# Patient Record
Sex: Male | Born: 1960 | Hispanic: No | Smoking: Current some day smoker
Health system: Southern US, Community
[De-identification: ages and names within clinical notes are randomized; demographics above are authoritative.]

## PROBLEM LIST (undated history)

## (undated) DIAGNOSIS — S42309A Unspecified fracture of shaft of humerus, unspecified arm, initial encounter for closed fracture: Secondary | ICD-10-CM

## (undated) HISTORY — DX: Unspecified fracture of shaft of humerus, unspecified arm, initial encounter for closed fracture: S42.309A

---

## 2001-03-03 HISTORY — PX: APPENDECTOMY: SHX54

## 2004-01-18 ENCOUNTER — Inpatient Hospital Stay (HOSPITAL_COMMUNITY): Admission: EM | Admit: 2004-01-18 | Discharge: 2004-01-19 | Payer: Self-pay | Admitting: Emergency Medicine

## 2005-07-08 IMAGING — CT CT PELVIS W/ CM
1 of 4 series · 14 of 32 positions shown, 19 images · IV contrast (omnipaque)
Comparison: none

CLINICAL DATA: Abdominal pain and nausea.
TECHNIQUE: Helical CT examination of the abdomen and pelvis was performed after the bolus infusion of a total of 150 cc of Omnipaque 300 and the use of dilute oral contrast. 
 The lung bases are clear. 
 CT OF THE ABDOMEN WITH CONTRAST:
 The liver, spleen, pancreas, adrenal glands and kidneys are unremarkable. There is a simple-appearing cyst associated with the right kidney.  The aorta is normal in caliber and the major branch vessels are normal.  The stomach is distended with contrast and demonstrates no gross abnormalities. The small bowel is slightly dilated and the colon appears unremarkable.  No abdominal masses or adenopathy. There are mild atherosclerotic changes involving the distal aorta. 
 The appendix is markedly inflamed and there is periappendiceal phlegmon and a small amount of fluid which is tracking down into the pelvis.  The terminal ileum appears normal. There is mild pericecal inflammatory change also.

[Series 2: abd/pelvis 5.0 b30f · axial · 0.68mm/px · z∈[+1220,+1664]mm · 14 of 101 slices shown, 19 images]
[im 6/101  soft-tissue]
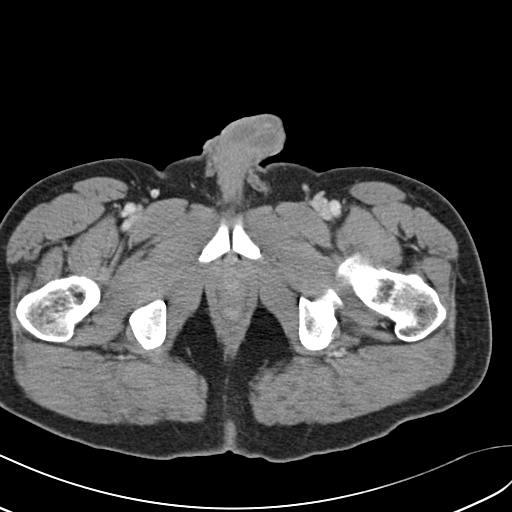
[im 6/101  bone]
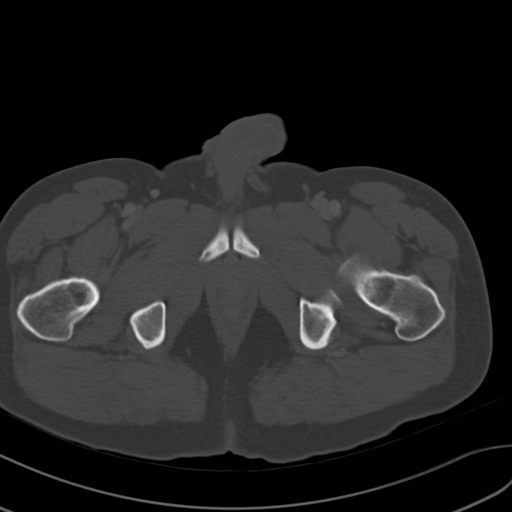
[im 12/101  soft-tissue]
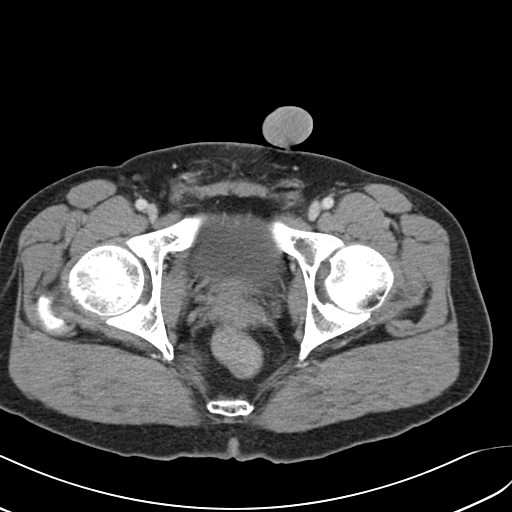
[im 24/101  soft-tissue]
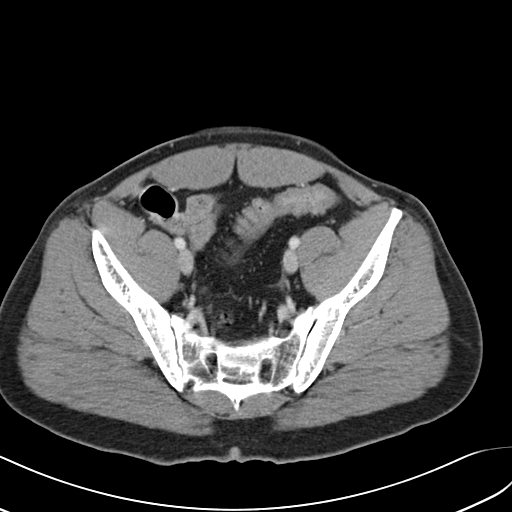
[im 30/101  soft-tissue]
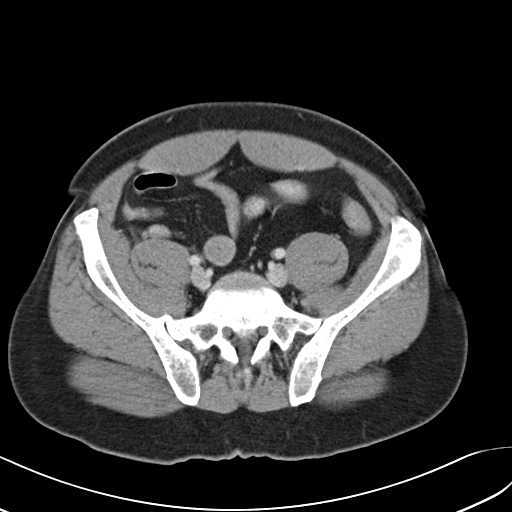
[im 36/101  soft-tissue]
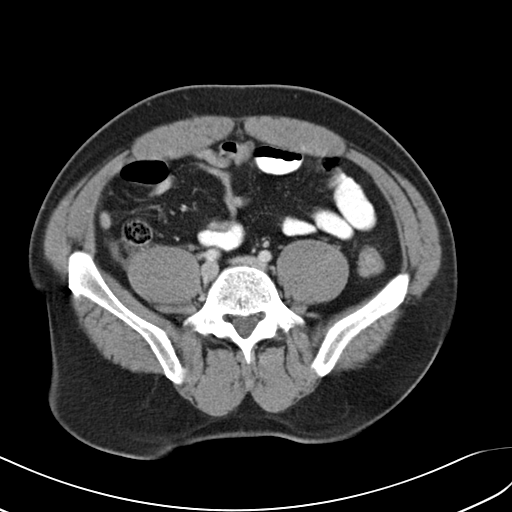
[im 42/101  soft-tissue]
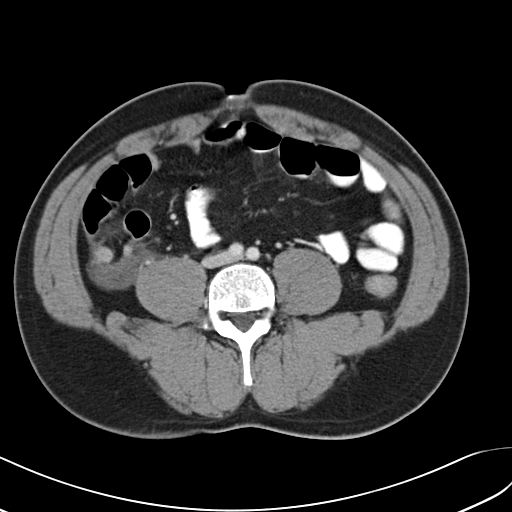
[im 53/101  soft-tissue]
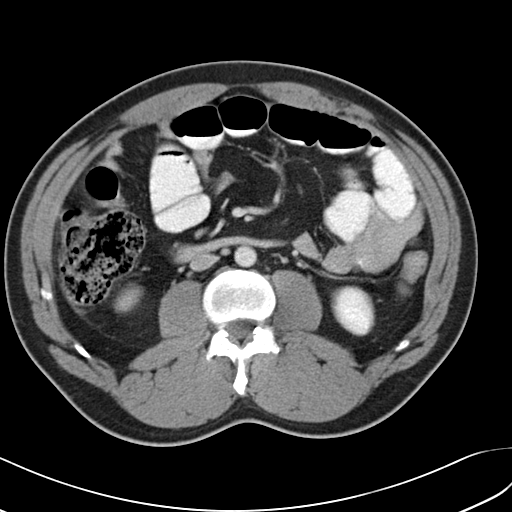
[im 59/101  soft-tissue]
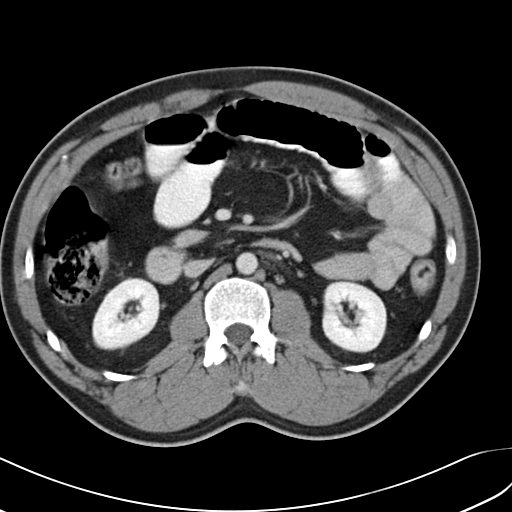
[im 65/101  soft-tissue]
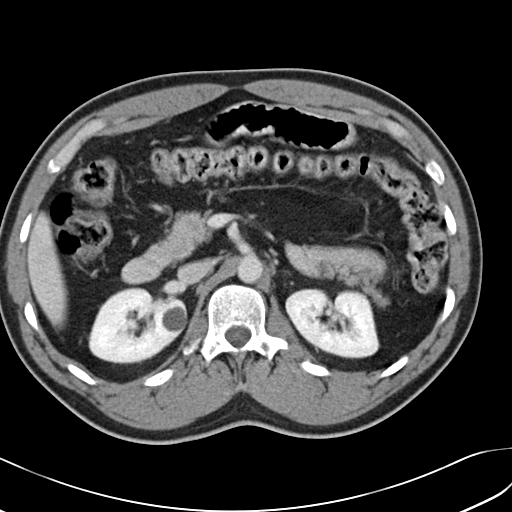
[im 65/101  bone]
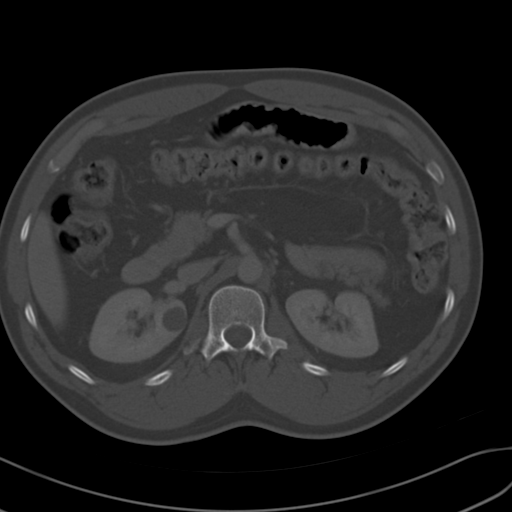
[im 71/101  soft-tissue]
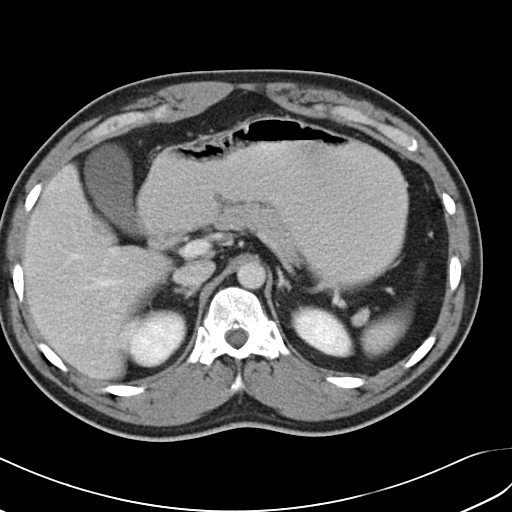
[im 77/101  soft-tissue]
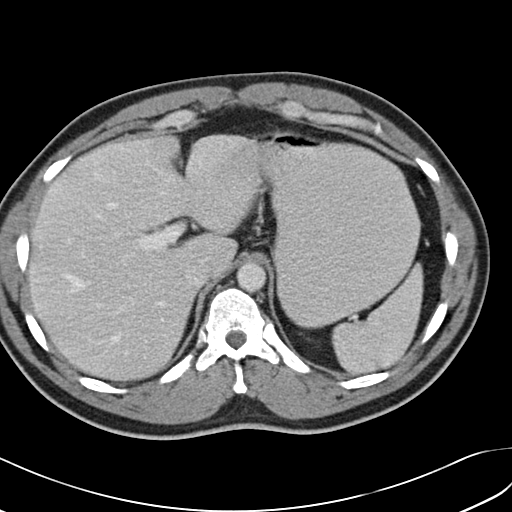
[im 77/101  lung]
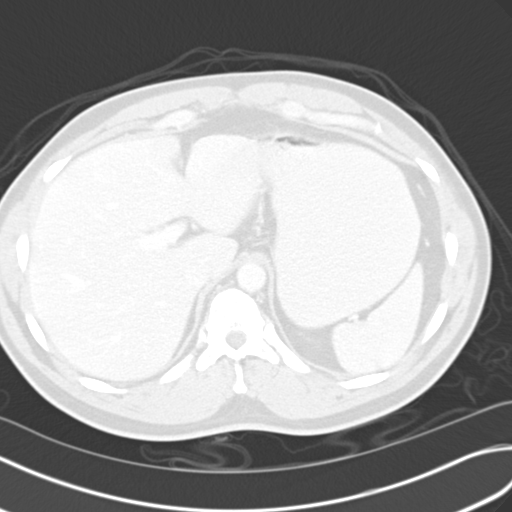
[im 83/101  lung]
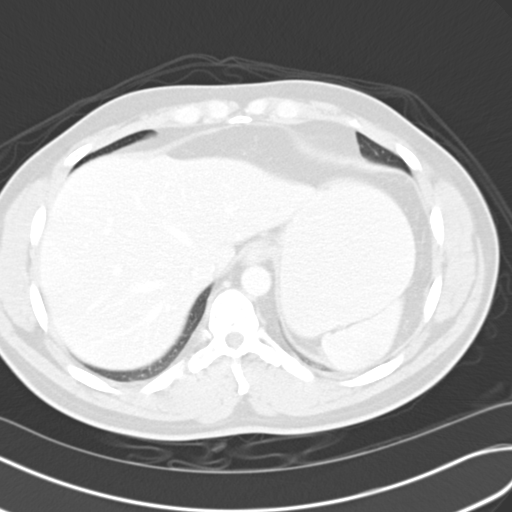
[im 89/101  soft-tissue]
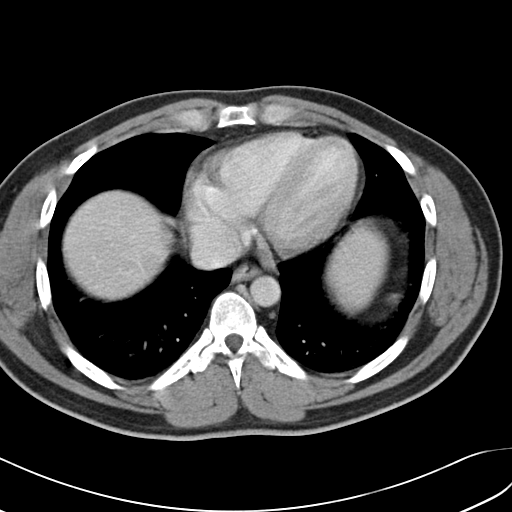
[im 89/101  lung]
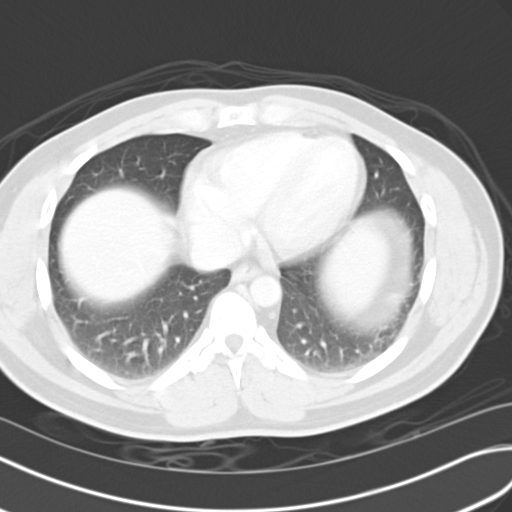
[im 95/101  soft-tissue]
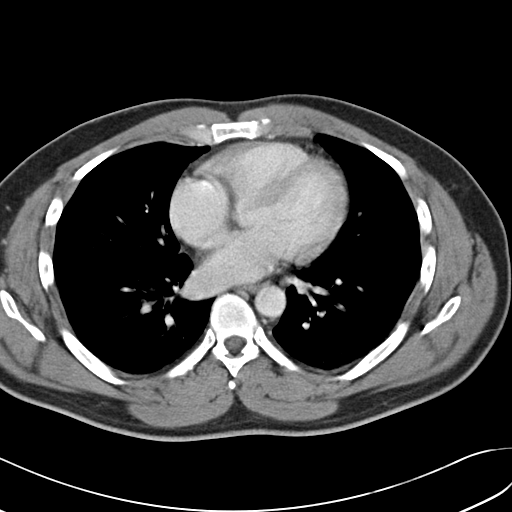
[im 95/101  lung]
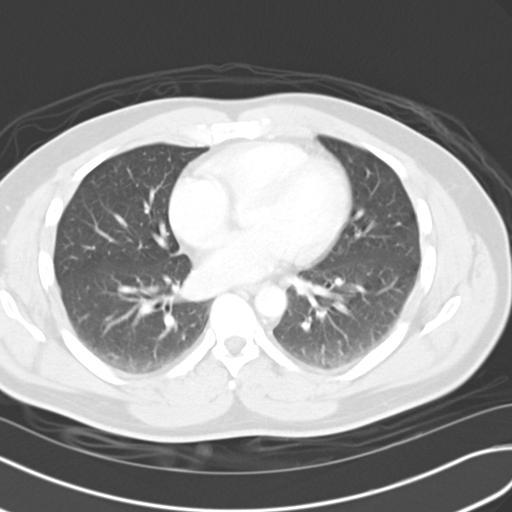

[14 of 32 positions shown; findings below may reference images not displayed]

IMPRESSION: 1. Findings consistent with appendicitis with periappendiceal phlegmon and fluid tracking down into the pelvic.  I couldn?t totally exclude perforation.  
 CT PELVIS WITH CONTRAST:
 Small amount of free pelvic fluid, likely from the patient?s appendicitis.  Small amount in the cul-de-sac and in the left paracolic gutter area.  The rectum, sigmoid colon, and visualized small bowel are grossly normal.  The bladder is normal.
IMPRESSION: 1.  Free fluid in the pelvis, likely from the patient?s appendicitis.

## 2015-04-16 ENCOUNTER — Ambulatory Visit: Payer: Medicaid Other | Attending: Family Medicine | Admitting: Family Medicine

## 2015-04-16 ENCOUNTER — Encounter: Payer: Self-pay | Admitting: Family Medicine

## 2015-04-16 ENCOUNTER — Encounter: Payer: Self-pay | Admitting: Gastroenterology

## 2015-04-16 VITALS — BP 159/81 | HR 88 | Temp 98.3°F | Resp 16 | Ht 69.5 in | Wt 181.0 lb

## 2015-04-16 DIAGNOSIS — Z87891 Personal history of nicotine dependence: Secondary | ICD-10-CM | POA: Diagnosis not present

## 2015-04-16 DIAGNOSIS — N400 Enlarged prostate without lower urinary tract symptoms: Secondary | ICD-10-CM | POA: Insufficient documentation

## 2015-04-16 DIAGNOSIS — R030 Elevated blood-pressure reading, without diagnosis of hypertension: Secondary | ICD-10-CM | POA: Insufficient documentation

## 2015-04-16 DIAGNOSIS — Z114 Encounter for screening for human immunodeficiency virus [HIV]: Secondary | ICD-10-CM

## 2015-04-16 DIAGNOSIS — Z9889 Other specified postprocedural states: Secondary | ICD-10-CM | POA: Diagnosis not present

## 2015-04-16 DIAGNOSIS — R109 Unspecified abdominal pain: Secondary | ICD-10-CM | POA: Insufficient documentation

## 2015-04-16 DIAGNOSIS — Z0000 Encounter for general adult medical examination without abnormal findings: Secondary | ICD-10-CM | POA: Diagnosis not present

## 2015-04-16 DIAGNOSIS — IMO0001 Reserved for inherently not codable concepts without codable children: Secondary | ICD-10-CM | POA: Insufficient documentation

## 2015-04-16 DIAGNOSIS — R1030 Lower abdominal pain, unspecified: Secondary | ICD-10-CM | POA: Insufficient documentation

## 2015-04-16 DIAGNOSIS — Z1159 Encounter for screening for other viral diseases: Secondary | ICD-10-CM

## 2015-04-16 LAB — CBC
HCT: 41.7 % (ref 39.0–52.0)
Hemoglobin: 14.6 g/dL (ref 13.0–17.0)
MCH: 31.7 pg (ref 26.0–34.0)
MCHC: 35 g/dL (ref 30.0–36.0)
MCV: 90.7 fL (ref 78.0–100.0)
MPV: 9 fL (ref 8.6–12.4)
Platelets: 201 10*3/uL (ref 150–400)
RBC: 4.6 MIL/uL (ref 4.22–5.81)
RDW: 13.3 % (ref 11.5–15.5)
WBC: 4.6 10*3/uL (ref 4.0–10.5)

## 2015-04-16 LAB — LIPID PANEL
Cholesterol: 167 mg/dL (ref 125–200)
HDL: 32 mg/dL — ABNORMAL LOW (ref >=40)
LDL Cholesterol: 122 mg/dL (ref <130)
Total CHOL/HDL Ratio: 5.2 Ratio — ABNORMAL HIGH (ref <=5.0)
Triglycerides: 64 mg/dL (ref <150)
VLDL: 13 mg/dL (ref <30)

## 2015-04-16 LAB — COMPLETE METABOLIC PANEL WITH GFR
ALT: 28 U/L (ref 9–46)
AST: 23 U/L (ref 10–35)
Albumin: 3.8 g/dL (ref 3.6–5.1)
Alkaline Phosphatase: 82 U/L (ref 40–115)
BUN: 12 mg/dL (ref 7–25)
CO2: 27 mmol/L (ref 20–31)
Calcium: 9.4 mg/dL (ref 8.6–10.3)
Chloride: 108 mmol/L (ref 98–110)
Creat: 1.13 mg/dL (ref 0.70–1.33)
GFR, Est African American: 86 mL/min (ref >=60)
GFR, Est Non African American: 74 mL/min (ref >=60)
Glucose, Bld: 103 mg/dL — ABNORMAL HIGH (ref 65–99)
Potassium: 4.7 mmol/L (ref 3.5–5.3)
Sodium: 141 mmol/L (ref 135–146)
Total Bilirubin: 0.8 mg/dL (ref 0.2–1.2)
Total Protein: 6.7 g/dL (ref 6.1–8.1)

## 2015-04-16 LAB — HEMOCCULT GUIAC POC 1CARD (OFFICE): Fecal Occult Blood, POC: NEGATIVE

## 2015-04-16 LAB — POCT URINALYSIS DIPSTICK
Bilirubin, UA: NEGATIVE
Glucose, UA: NEGATIVE
Ketones, UA: NEGATIVE
Leukocytes, UA: NEGATIVE
Nitrite, UA: NEGATIVE
Spec Grav, UA: >=1.03
Urobilinogen, UA: 0.2
pH, UA: 5.5

## 2015-04-16 LAB — POCT GLYCOSYLATED HEMOGLOBIN (HGB A1C): Hemoglobin A1C: 6

## 2015-04-16 NOTE — Progress Notes (Signed)
Complaining of low abdominal pain and pain on button No pain today. No tobacco user  No suicidal thought in the past two weeks

## 2015-04-16 NOTE — Progress Notes (Signed)
LOGO@  Subjective:  Patient ID: Devon Hill, male    DOB: 06/03/1960  Age: 56 y.o. MRN: [id number]  CC: Abdominal Pain   HPI Devon Hill presents for    1. Lower abdominal pain: x 1-2 months. At the onset the pain was sudden. He was sitting on the toilet, grabbed his testicles and vomited. No recurrent vomiting. He has not had a colonoscopy.  Also gets pain in his in L bottom. That comes and goes. No blood in stool. No fever or chills.  No pain currently.  He has a dull pain a few days ago. He has noticed weight loss but unsure of the exact amount.   History reviewed. No pertinent past medical history.  Past Surgical History  Procedure Laterality Date  . Appendectomy  2003    Family History  Problem Relation Age of Onset  . Diabetes Mother     Social History  Substance Use Topics  . Smoking status: Former Research scientist (life sciences)  . Smokeless tobacco: Not on file  . Alcohol Use: Not on file     Comment: [place]    ROS Review of Systems  Constitutional: Negative for fever, chills, fatigue and unexpected weight change.  Eyes: Negative for visual disturbance.  Respiratory: Negative for cough and shortness of breath.   Cardiovascular: Negative for chest pain, palpitations and leg swelling.  Gastrointestinal: Positive for abdominal pain. Negative for nausea, vomiting, diarrhea, constipation, blood in stool, abdominal distention, anal bleeding and rectal pain.  Endocrine: Negative for polydipsia, polyphagia and polyuria.  Genitourinary: Positive for frequency. Negative for dysuria, urgency, hematuria, flank pain, decreased urine volume, discharge, penile swelling, scrotal swelling, enuresis, difficulty urinating, genital sores, penile pain and testicular pain.  Musculoskeletal: Negative for myalgias, back pain, arthralgias, gait problem and neck pain.  Skin: Negative for rash.  Allergic/Immunologic: Negative for immunocompromised state.  Hematological: Negative for adenopathy. Does not  bruise/bleed easily.  Psychiatric/Behavioral: Negative for suicidal ideas, sleep disturbance and dysphoric mood. The patient is not nervous/anxious.     Objective:   Today's Vitals: BP 159/81 mmHg  Pulse 88  Temp(Src) 98.3 F (36.8 C) (Oral)  Resp 16  Ht 5' 9.5" (1.765 m)  Wt 181 lb (82.101 kg)  BMI 26.35 kg/m2  SpO2 98%  Physical Exam  Constitutional: He appears well-developed and well-nourished. No distress.  HENT:  Head: Normocephalic and atraumatic.  Neck: Normal range of motion. Neck supple.  Cardiovascular: Normal rate, regular rhythm, normal heart sounds and intact distal pulses.   Pulmonary/Chest: Effort normal and breath sounds normal.  Abdominal: There is no hepatosplenomegaly, splenomegaly or hepatomegaly. There is no tenderness. There is no rebound and no CVA tenderness. No hernia. Hernia confirmed negative in the ventral area, confirmed negative in the right inguinal area and confirmed negative in the left inguinal area.  Genitourinary: Rectum normal, testes normal and penis normal. Rectal exam shows no external hemorrhoid, no internal hemorrhoid, no fissure, no mass, no tenderness and anal tone normal. Guaiac negative stool. Prostate is enlarged. Prostate is not tender. Circumcised. No penile tenderness.  Musculoskeletal: He exhibits no edema.  Lymphadenopathy:       Right: No inguinal adenopathy present.       Left: No inguinal adenopathy present.  Neurological: He is alert.  Skin: Skin is warm and dry. No rash noted. No erythema.  Psychiatric: He has a normal mood and affect.    Lab Results  Component Value Date   HGBA1C 6.0 04/16/2015   UA: moderate blood UA  Assessment & Plan:   Problem List Items Addressed This Visit    Enlarged prostate   Relevant Orders   PSA   Elevated BP   Relevant Orders   Lipid Panel   Abdominal pain   Relevant Orders   COMPLETE METABOLIC PANEL WITH GFR   CBC   Hemoccult - 1 Card (office) (Completed)    Other Visit  Diagnoses    Healthcare maintenance    -  Primary    Relevant Orders    HgB A1c (Completed)    POCT urinalysis dipstick (Completed)    Ambulatory referral to Gastroenterology    Screening for HIV (human immunodeficiency virus)        Relevant Orders    HIV antibody (with reflex)    Need for hepatitis C screening test        Relevant Orders    Hepatitis C antibody, reflex       No outpatient encounter prescriptions on file as of 04/16/2015.   No facility-administered encounter medications on file as of 04/16/2015.    Follow-up: No Follow-up on file.    Boykin Nearing MD

## 2015-04-16 NOTE — Assessment & Plan Note (Signed)
A: elevated BP suspect HTN P: Repeat BP in two weeks If elevated, start norvasc 5 mg daily  CMP Lipids

## 2015-04-16 NOTE — Assessment & Plan Note (Signed)
Enlarged prostate on exam suspect BPH  PSA ordered

## 2015-04-16 NOTE — Patient Instructions (Addendum)
Devon Hill was seen today for abdominal pain.  Diagnoses and all orders for this visit:  Healthcare maintenance -     HgB A1c -     POCT urinalysis dipstick -     Ambulatory referral to Gastroenterology  Lower abdominal pain -     COMPLETE METABOLIC PANEL WITH GFR -     CBC -     Hemoccult - 1 Card (office)  Elevated BP -     Lipid Panel  Screening for HIV (human immunodeficiency virus) -     HIV antibody (with reflex)  Need for hepatitis C screening test -     Hepatitis C antibody, reflex  Enlarged prostate -     PSA    F/u in 2 weeks for BP check with clinical pharmacologist  F/u with me in 6 weeks for abdominal pain suspected stone   Dr. Adrian Blackwater  Hypertension Hypertension, commonly called high blood pressure, is when the force of blood pumping through your arteries is too strong. Your arteries are the blood vessels that carry blood from your heart throughout your body. A blood pressure reading consists of a higher number over a lower number, such as 110/72. The higher number (systolic) is the pressure inside your arteries when your heart pumps. The lower number (diastolic) is the pressure inside your arteries when your heart relaxes. Ideally you want your blood pressure below 120/80. Hypertension forces your heart to work harder to pump blood. Your arteries may become narrow or stiff. Having untreated or uncontrolled hypertension can cause heart attack, stroke, kidney disease, and other problems. RISK FACTORS Some risk factors for high blood pressure are controllable. Others are not.  Risk factors you cannot control include:   Race. You may be at higher risk if you are African American.  Age. Risk increases with age.  Gender. Men are at higher risk than women before age 48 years. After age 74, women are at higher risk than men. Risk factors you can control include:  Not getting enough exercise or physical activity.  Being overweight.  Getting too much fat, sugar,  calories, or salt in your diet.  Drinking too much alcohol. SIGNS AND SYMPTOMS Hypertension does not usually cause signs or symptoms. Extremely high blood pressure (hypertensive crisis) may cause headache, anxiety, shortness of breath, and nosebleed. DIAGNOSIS To check if you have hypertension, your health care provider will measure your blood pressure while you are seated, with your arm held at the level of your heart. It should be measured at least twice using the same arm. Certain conditions can cause a difference in blood pressure between your right and left arms. A blood pressure reading that is higher than normal on one occasion does not mean that you need treatment. If it is not clear whether you have high blood pressure, you may be asked to return on a different day to have your blood pressure checked again. Or, you may be asked to monitor your blood pressure at home for 1 or more weeks. TREATMENT Treating high blood pressure includes making lifestyle changes and possibly taking medicine. Living a healthy lifestyle can help lower high blood pressure. You may need to change some of your habits. Lifestyle changes may include:  Following the DASH diet. This diet is high in fruits, vegetables, and whole grains. It is low in salt, red meat, and added sugars.  Keep your sodium intake below 2,300 mg per day.  Getting at least 30-45 minutes of aerobic exercise at least 4  times per week.  Losing weight if necessary.  Not smoking.  Limiting alcoholic beverages.  Learning ways to reduce stress. Your health care provider may prescribe medicine if lifestyle changes are not enough to get your blood pressure under control, and if one of the following is true:  You are 35-44 years of age and your systolic blood pressure is above 140.  You are 110 years of age or older, and your systolic blood pressure is above 150.  Your diastolic blood pressure is above 90.  You have diabetes, and your  systolic blood pressure is over [id number] or your diastolic blood pressure is over 90.  You have kidney disease and your blood pressure is above 140/90.  You have heart disease and your blood pressure is above 140/90. Your personal target blood pressure may vary depending on your medical conditions, your age, and other factors. HOME CARE INSTRUCTIONS  Have your blood pressure rechecked as directed by your health care provider.   Take medicines only as directed by your health care provider. Follow the directions carefully. Blood pressure medicines must be taken as prescribed. The medicine does not work as well when you skip doses. Skipping doses also puts you at risk for problems.  Do not smoke.   Monitor your blood pressure at home as directed by your health care provider. SEEK MEDICAL CARE IF:   You think you are having a reaction to medicines taken.  You have recurrent headaches or feel dizzy.  You have swelling in your ankles.  You have trouble with your vision. SEEK IMMEDIATE MEDICAL CARE IF:  You develop a severe headache or confusion.  You have unusual weakness, numbness, or feel faint.  You have severe chest or abdominal pain.  You vomit repeatedly.  You have trouble breathing. MAKE SURE YOU:   Understand these instructions.  Will watch your condition.  Will get help right away if you are not doing well or get worse.   This information is not intended to replace advice given to you by your health care provider. Make sure you discuss any questions you have with your health care provider.   Document Released: 02/17/2005 Document Revised: 07/04/2014 Document Reviewed: 12/10/2012 Elsevier Interactive Patient Education 2016 [place].   Kidney Stones Kidney stones (urolithiasis) are solid masses that form inside your kidneys. The intense pain is caused by the stone moving through the kidney, ureter, bladder, and urethra (urinary tract). When the stone moves, the  ureter starts to spasm around the stone. The stone is usually passed in your pee (urine).  HOME CARE  Drink enough fluids to keep your pee clear or pale yellow. This helps to get the stone out.  Take a 24-hour pee (urine) sample as told by your doctor. You may need to take another sample every 6-12 months.  Strain all pee through the provided strainer. Do not pee without peeing through the strainer, not even once. If you pee the stone out, catch it in the strainer. The stone may be as small as a grain of salt. Take this to your doctor. This will help your doctor figure out what you can do to try to prevent more kidney stones.  Only take medicine as told by your doctor.  Make changes to your daily diet as told by your doctor. You may be told to:  Limit how much salt you eat.  Eat 5 or more servings of fruits and vegetables each day.  Limit how much meat, poultry, fish, and  eggs you eat.  Keep all follow-up visits as told by your doctor. This is important.  Get follow-up X-rays as told by your doctor. GET HELP IF: You have pain that gets worse even if you have been taking pain medicine. GET HELP RIGHT AWAY IF:   Your pain does not get better with medicine.  You have a fever or shaking chills.  Your pain increases and gets worse over 18 hours.  You have new belly (abdominal) pain.  You feel faint or pass out.  You are unable to pee.   This information is not intended to replace advice given to you by your health care provider. Make sure you discuss any questions you have with your health care provider.   Document Released: 08/06/2007 Document Revised: 11/08/2014 Document Reviewed: 07/21/2012 Elsevier Interactive Patient Education [employer].

## 2015-04-16 NOTE — Assessment & Plan Note (Signed)
A: lower abdominal pain suspect kidney stones P: CMP Liberal hydration Strain urine NSAID prn Imaging prn persistent, moderate to severe pain

## 2015-04-17 ENCOUNTER — Telehealth: Payer: Self-pay

## 2015-04-17 LAB — HEPATITIS C ANTIBODY: HCV Ab: NEGATIVE

## 2015-04-17 LAB — PSA: PSA: 0.98 ng/mL (ref <=4.00)

## 2015-04-17 LAB — HIV ANTIBODY (ROUTINE TESTING W REFLEX): HIV 1&2 Ab, 4th Generation: NONREACTIVE

## 2015-04-17 NOTE — Telephone Encounter (Signed)
-----   Message from Boykin Nearing, MD sent at 04/17/2015  7:55 AM EST ----- All labs normal

## 2015-04-17 NOTE — Telephone Encounter (Signed)
Spoke with patient and he is aware of his normal Labs Patient still concerned because he is still having the pains In his lower abd

## 2015-06-04 ENCOUNTER — Ambulatory Visit (AMBULATORY_SURGERY_CENTER): Payer: Self-pay | Admitting: *Deleted

## 2015-06-04 VITALS — Ht 69.0 in | Wt 178.0 lb

## 2015-06-04 DIAGNOSIS — Z1211 Encounter for screening for malignant neoplasm of colon: Secondary | ICD-10-CM

## 2015-06-04 NOTE — Progress Notes (Signed)
No egg or soy allergy known to patient  No issues with past sedation with any surgeries  or procedures, no intubation problems  No diet pills per patient No home 02 use per patient  No blood thinners per patient  Pt denies issues with constipation   

## 2015-06-18 ENCOUNTER — Encounter: Payer: Self-pay | Admitting: Gastroenterology

## 2015-06-18 ENCOUNTER — Ambulatory Visit (AMBULATORY_SURGERY_CENTER): Payer: Medicaid Other | Admitting: Gastroenterology

## 2015-06-18 VITALS — BP 131/73 | HR 77 | Temp 97.8°F | Resp 12 | Ht 69.0 in | Wt 178.0 lb

## 2015-06-18 DIAGNOSIS — D123 Benign neoplasm of transverse colon: Secondary | ICD-10-CM

## 2015-06-18 DIAGNOSIS — Z1211 Encounter for screening for malignant neoplasm of colon: Secondary | ICD-10-CM | POA: Diagnosis not present

## 2015-06-18 DIAGNOSIS — D122 Benign neoplasm of ascending colon: Secondary | ICD-10-CM

## 2015-06-18 DIAGNOSIS — K635 Polyp of colon: Secondary | ICD-10-CM

## 2015-06-18 MED ORDER — SODIUM CHLORIDE 0.9 % IV SOLN: 500.0000 mL | INTRAVENOUS | Status: DC

## 2015-06-18 NOTE — Patient Instructions (Signed)
Discharge instructions given. Handouts on polyps and hemorrhoids. Resume previous medications. YOU HAD AN ENDOSCOPIC PROCEDURE TODAY AT THE [facility]:   Refer to the procedure report that was given to you for any specific questions about what was found during the examination.  If the procedure report does not answer your questions, please call your gastroenterologist to clarify.  If you requested that your care partner not be given the details of your procedure findings, then the procedure report has been included in a sealed envelope for you to review at your convenience later.  YOU SHOULD EXPECT: Some feelings of bloating in the abdomen. Passage of more gas than usual.  Walking can help get rid of the air that was put into your GI tract during the procedure and reduce the bloating. If you had a lower endoscopy (such as a colonoscopy or flexible sigmoidoscopy) you may notice spotting of blood in your stool or on the toilet paper. If you underwent a bowel prep for your procedure, you may not have a normal bowel movement for a few days.  Please Note:  You might notice some irritation and congestion in your nose or some drainage.  This is from the oxygen used during your procedure.  There is no need for concern and it should clear up in a day or so.  SYMPTOMS TO REPORT IMMEDIATELY:   Following lower endoscopy (colonoscopy or flexible sigmoidoscopy):  Excessive amounts of blood in the stool  Significant tenderness or worsening of abdominal pains  Swelling of the abdomen that is new, acute  Fever of 100F or higher   For urgent or emergent issues, a gastroenterologist can be reached at any hour by calling [phone].   DIET: Your first meal following the procedure should be a small meal and then it is ok to progress to your normal diet. Heavy or fried foods are harder to digest and may make you feel nauseous or bloated.  Likewise, meals heavy in dairy and vegetables can increase  bloating.  Drink plenty of fluids but you should avoid alcoholic beverages for 24 hours.  ACTIVITY:  You should plan to take it easy for the rest of today and you should NOT DRIVE or use heavy machinery until tomorrow (because of the sedation medicines used during the test).    FOLLOW UP: Our staff will call the number listed on your records the next business day following your procedure to check on you and address any questions or concerns that you may have regarding the information given to you following your procedure. If we do not reach you, we will leave a message.  However, if you are feeling well and you are not experiencing any problems, there is no need to return our call.  We will assume that you have returned to your regular daily activities without incident.  If any biopsies were taken you will be contacted by phone or by letter within the next 1-3 weeks.  Please call us at [phone] if you have not heard about the biopsies in 3 weeks.    SIGNATURES/CONFIDENTIALITY: You and/or your care partner have signed paperwork which will be entered into your electronic medical record.  These signatures attest to the fact that that the information above on your After Visit Summary has been reviewed and is understood.  Full responsibility of the confidentiality of this discharge information lies with you and/or your care-partner. 

## 2015-06-18 NOTE — Progress Notes (Signed)
Report given to PACU RN, vss 

## 2015-06-18 NOTE — Progress Notes (Signed)
Called to room to assist during endoscopic procedure.  Patient ID and intended procedure confirmed with present staff. Received instructions for my participation in the procedure from the performing physician.  

## 2015-06-18 NOTE — Op Note (Signed)
[facility] Patient Name: Devon Hill Procedure Date: 06/18/2015 11:18 AM MRN: [id number] Endoscopist: Mallie Mussel L. Loletha Carrow , MD Age: 55 Date of Birth: 1960-10-29 Gender: Male Procedure:                Colonoscopy Indications:              Screening for colorectal malignant neoplasm Medicines:                Monitored Anesthesia Care Procedure:                Pre-Anesthesia Assessment:                           - Prior to the procedure, a History and Physical                            was performed, and patient medications and                            allergies were reviewed. The patient's tolerance of                            previous anesthesia was also reviewed. The risks                            and benefits of the procedure and the sedation                            options and risks were discussed with the patient.                            All questions were answered, and informed consent                            was obtained. Prior Anticoagulants: The patient has                            taken no previous anticoagulant or antiplatelet                            agents. ASA Grade Assessment: I - A normal, healthy                            patient. After reviewing the risks and benefits,                            the patient was deemed in satisfactory condition to                            undergo the procedure.                           After obtaining informed consent, the colonoscope  was passed under direct vision. Throughout the                            procedure, the patient's blood pressure, pulse, and                            oxygen saturations were monitored continuously. The                            Model CF-HQ190L [id number]) scope was introduced                            through the anus and advanced to the the cecum,                            identified by appendiceal orifice and ileocecal                             valve. The colonoscopy was performed without                            difficulty. The patient tolerated the procedure                            well. The quality of the bowel preparation was                            good. The ileocecal valve, appendiceal orifice, and                            rectum were photographed. The quality of the bowel                            preparation was evaluated using the BBPS [facility]                            Bowel Preparation Scale) with scores of: Right                            Colon = 2, Transverse Colon = 2 and Left Colon = 2.                            The total BBPS score equals 6. The bowel                            preparation used was Miralax. Scope In: 11:33:34 AM Scope Out: 11:47:57 AM Scope Withdrawal Time: 0 hours 10 minutes 3 seconds  Total Procedure Duration: 0 hours 14 minutes 23 seconds  Findings:                 The perianal and digital rectal examinations were                            normal.  A 4 mm polyp was found in the proximal ascending                            colon. The polyp was sessile. The polyp was removed                            with a cold snare. Resection and retrieval were                            complete.                           Four sessile polyps were found in the transverse                            colon. The polyps were 4 to 8 mm in size. These                            polyps were removed with a cold snare. Resection                            and retrieval were complete.                           Internal hemorrhoids were found during                            retroflexion. The hemorrhoids were Grade I                            (internal hemorrhoids that do not prolapse).                           The exam was otherwise without abnormality. Complications:            No immediate complications. Estimated Blood Loss:     Estimated blood loss was  minimal. Impression:               - One 4 mm polyp in the proximal ascending colon,                            removed with a cold snare. Resected and retrieved.                           - Four 4 to 8 mm polyps in the transverse colon,                            removed with a cold snare. Resected and retrieved.                           - Internal hemorrhoids.                           - The examination was otherwise normal. Recommendation:           -  Patient has a contact number available for                            emergencies. The signs and symptoms of potential                            delayed complications were discussed with the                            patient. Return to normal activities tomorrow.                            Written discharge instructions were provided to the                            patient.                           - Resume previous diet.                           - Continue present medications.                           - Await pathology results.                           - Repeat colonoscopy is recommended for                            surveillance. The colonoscopy date will be                            determined after pathology results from today's                            exam become available for review. Henry L. Loletha Carrow, MD 06/18/2015 11:56:18 AM This report has been signed electronically.

## 2015-06-19 ENCOUNTER — Telehealth: Payer: Self-pay

## 2015-06-19 NOTE — Telephone Encounter (Signed)
  Follow up Call-  Call back number 06/18/2015  Post procedure Call Back phone  # [phone]  Permission to leave phone message Yes     Patient questions:  Do you have a fever, pain , or abdominal swelling? No. Pain Score  0 *  Have you tolerated food without any problems? Yes.    Have you been able to return to your normal activities? Yes.    Do you have any questions about your discharge instructions: Diet   No. Medications  No. Follow up visit  No.  Do you have questions or concerns about your Care? No.  Actions: * If pain score is 4 or above: No action needed, pain <4.

## 2015-06-21 ENCOUNTER — Encounter: Payer: Self-pay | Admitting: Gastroenterology

## 2015-07-06 ENCOUNTER — Encounter: Payer: Self-pay | Admitting: Family Medicine

## 2018-01-01 ENCOUNTER — Ambulatory Visit: Payer: Self-pay | Admitting: Family Medicine

## 2020-10-01 ENCOUNTER — Encounter: Payer: Self-pay | Admitting: Gastroenterology
